# Patient Record
Sex: Male | Born: 1990 | Race: Black or African American | Hispanic: No | Marital: Single | Smoking: Current every day smoker
Health system: Southern US, Community
[De-identification: ages and names within clinical notes are randomized; demographics above are authoritative.]

---
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
## 2011-06-22 ENCOUNTER — Emergency Department: Payer: Self-pay | Admitting: Emergency Medicine

## 2011-06-22 LAB — COMPREHENSIVE METABOLIC PANEL
Albumin: 4.7 g/dL (ref 3.4–5.0)
Calcium, Total: 9.3 mg/dL (ref 8.5–10.1)
Chloride: 109 mmol/L — ABNORMAL HIGH (ref 98–107)
Co2: 28 mmol/L (ref 21–32)
Creatinine: 0.93 mg/dL (ref 0.60–1.30)
Osmolality: 284 (ref 275–301)
Potassium: 3.6 mmol/L (ref 3.5–5.1)
SGOT(AST): 27 U/L (ref 15–37)
SGPT (ALT): 73 U/L

## 2011-06-22 LAB — CBC
HGB: 14.7 g/dL (ref 13.0–18.0)
MCHC: 33.5 g/dL (ref 32.0–36.0)
RBC: 4.7 10*6/uL (ref 4.40–5.90)
RDW: 13.1 % (ref 11.5–14.5)
WBC: 6.9 10*3/uL (ref 3.8–10.6)

## 2015-03-06 ENCOUNTER — Encounter: Payer: Self-pay | Admitting: *Deleted

## 2015-03-06 ENCOUNTER — Emergency Department
Admission: EM | Admit: 2015-03-06 | Discharge: 2015-03-06 | Disposition: A | Discharge status: Home / Self Care | Payer: 59 | Attending: Emergency Medicine | Admitting: Emergency Medicine

## 2015-03-06 ENCOUNTER — Encounter: Payer: Self-pay | Admitting: Emergency Medicine

## 2015-03-06 ENCOUNTER — Emergency Department: Payer: 59

## 2015-03-06 ENCOUNTER — Ambulatory Visit (INDEPENDENT_AMBULATORY_CARE_PROVIDER_SITE_OTHER)
Admission: EM | Admit: 2015-03-06 | Discharge: 2015-03-06 | Disposition: A | Discharge status: Other Acute Inpatient Hospital | Payer: 59 | Source: Home / Self Care | Attending: Family Medicine | Admitting: Family Medicine

## 2015-03-06 DIAGNOSIS — L0291 Cutaneous abscess, unspecified: Secondary | ICD-10-CM

## 2015-03-06 DIAGNOSIS — N492 Inflammatory disorders of scrotum: Secondary | ICD-10-CM | POA: Diagnosis not present

## 2015-03-06 DIAGNOSIS — R1909 Other intra-abdominal and pelvic swelling, mass and lump: Secondary | ICD-10-CM

## 2015-03-06 DIAGNOSIS — R1031 Right lower quadrant pain: Secondary | ICD-10-CM | POA: Diagnosis present

## 2015-03-06 DIAGNOSIS — F17210 Nicotine dependence, cigarettes, uncomplicated: Secondary | ICD-10-CM | POA: Diagnosis not present

## 2015-03-06 DIAGNOSIS — N5089 Other specified disorders of the male genital organs: Secondary | ICD-10-CM

## 2015-03-06 MED ORDER — OXYCODONE-ACETAMINOPHEN 5-325 MG PO TABS: 2.0000 | ORAL_TABLET | Freq: Four times a day (QID) | ORAL | Status: DC | PRN

## 2015-03-06 MED ORDER — LIDOCAINE-EPINEPHRINE 1 %-1:100000 IJ SOLN
10.0000 mL | Freq: Once | INTRAMUSCULAR | Status: AC
  Administered 2015-03-06: 10 mL via INTRADERMAL
  Filled 2015-03-06 (×2): qty 10

## 2015-03-06 MED ORDER — OXYCODONE-ACETAMINOPHEN 5-325 MG PO TABS
ORAL_TABLET | ORAL | Status: AC
  Administered 2015-03-06: 2 via ORAL
  Filled 2015-03-06: qty 2

## 2015-03-06 MED ORDER — SULFAMETHOXAZOLE-TRIMETHOPRIM 800-160 MG PO TABS: 1.0000 | ORAL_TABLET | Freq: Two times a day (BID) | ORAL | Status: DC

## 2015-03-06 MED ORDER — HYDROMORPHONE HCL 1 MG/ML IJ SOLN
1.0000 mg | Freq: Once | INTRAMUSCULAR | Status: AC
  Administered 2015-03-06: 1 mg via INTRAVENOUS
  Filled 2015-03-06: qty 1

## 2015-03-06 MED ORDER — MUPIROCIN 2 % EX OINT: TOPICAL_OINTMENT | CUTANEOUS | Status: DC

## 2015-03-06 MED ORDER — OXYCODONE-ACETAMINOPHEN 5-325 MG PO TABS
2.0000 | ORAL_TABLET | Freq: Once | ORAL | Status: AC
  Administered 2015-03-06: 2 via ORAL

## 2015-03-06 NOTE — ED Notes (Signed)
Patient reports right groin pain for approximately 5 days.  Seen at urgent care and sent to the ed for further testing.

## 2015-03-06 NOTE — ED Notes (Signed)
Pt complains of pain to right groin area. MD at bedside and abscess noted.

## 2015-03-06 NOTE — ED Provider Notes (Signed)
[facility] Emergency Department Provider Note     Time seen: ----------------------------------------- 7:59 PM on 03/06/2015 -----------------------------------------    I have reviewed the triage vital signs and the nursing notes.   HISTORY  Chief Complaint Groin Pain    HPI Leonard Reese is a 25 y.o. male who presents ER for right groin pain for last 5 days. Patient was seen in urgent care and sent to the ER for further testing. He is complaining of a lump near his testicles since Tuesday, does note he has had recent heavy lifting, denies fevers chills, discharge or any other complaints. Patient states this increased in size slightly.   History reviewed. No pertinent past medical history.  There are no active problems to display for this patient.   History reviewed. No pertinent past surgical history.  Allergies Review of patient's allergies indicates no known allergies.  Social History Social History  Substance Use Topics  . Smoking status: Current Every Day Smoker -- 0.50 packs/day    Types: Cigarettes  . Smokeless tobacco: Never Used  . Alcohol Use: Yes    Review of Systems Constitutional: Negative for fever. Gastrointestinal: Negative for abdominal pain, vomiting and diarrhea. Genitourinary: Negative for dysuria. Positive for right-sided scrotal pain Skin: Positive for swelling near the right testicle Neurological: Negative for headaches, focal weakness or numbness.   ____________________________________________   PHYSICAL EXAM:  VITAL SIGNS: ED Triage Vitals  Enc Vitals Group     BP 03/06/15 1947 130/76 mmHg     Pulse Rate 03/06/15 1947 63     Resp 03/06/15 1947 20     Temp 03/06/15 1947 98.4 F (36.9 C)     Temp Source 03/06/15 1947 Oral     SpO2 03/06/15 1947 100 %     Weight 03/06/15 1947 180 lb (81.647 kg)     Height 03/06/15 1947  (1.803 m)     Head Cir --      Peak Flow --      Pain Score 03/06/15 1948  10     Pain Loc --      Pain Edu? --      Excl. in GC? --     Constitutional: Alert and oriented. Well appearing and in no distress. Gastrointestinal: Soft and nontender. No distention. No abdominal bruits.  Genitourinary: Right proximal scrotal swelling and induration consistent with abscess Musculoskeletal: Nontender with normal range of motion in all extremities. No joint effusions.  No lower extremity tenderness nor edema. Skin:  Mild erythema, induration noted to the right upper outer scrotal region Psychiatric: Mood and affect are normal. ____________________________________________  ED COURSE:  Pertinent labs & imaging results that were available during my care of the patient were reviewed by me and considered in my medical decision making (see chart for details). Patient clinically with developing scrotal abscess. We'll obtain ultrasound and likely need incision and drainage.  INCISION AND DRAINAGE Performed by: Daryel November E Consent: Verbal consent obtained. Risks and benefits: risks, benefits and alternatives were discussed Type: abscess  Body area: Right hemiscrotum  Anesthesia: local infiltration  Incision was made with a scalpel.  Local anesthetic: lidocaine 1 % with epinephrine  Anesthetic total: 3 ml  Complexity: complex Blunt dissection to break up loculations  Drainage: purulent  Drainage amount: Moderate   Packing material: 1/4 in iodoform gauze  Patient tolerance: Patient tolerated the procedure well with no immediate complications.   ____________________________________________   RADIOLOGY Images were viewed by me  Testicular ultrasound  IMPRESSION: Complex collection in the superolateral aspect of the right scrotal wall, likely a hematoma or an abscess. Clinical correlation and follow-up recommended.  Unremarkable testicles.  ____________________________________________  FINAL ASSESSMENT AND PLAN  Abscess, incision and  drainage  Plan: Patient with labs and imaging as dictated above. Patient has undergone incision and drainage of right scrotal abscess. I advised packing removal tomorrow, sitz bath, antibiotics, topical antibiotic ointment and pain medication. He can return for worsening or worrisome symptoms.   Emily Filbert, MD   Emily Filbert, MD 03/06/15 2206

## 2015-03-06 NOTE — ED Notes (Signed)
Patient transported to Ultrasound 

## 2015-03-06 NOTE — ED Notes (Signed)
Patient noticed a bulge on the right side of his groin area this past Tuesday and the pain has become worsened overtime. 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

## 2015-03-06 NOTE — Discharge Instructions (Signed)

## 2015-03-06 NOTE — ED Provider Notes (Signed)
CSN: [id number]     Arrival date & time 03/06/15  1833 History   First MD Initiated Contact with Patient 03/06/15 1925     Chief Complaint  Patient presents with  . Inguinal Hernia   (Consider location/radiation/quality/duration/timing/severity/associated sxs/prior Treatment) HPI Comments: 24 yo male with 4 days h/o a "lump near balls". States Tuesday he noticed a tender small lump on the right side near the upper scrotal area. Denies any trauma/injuries, fevers, chills, dysuria, penile discharge. States has increased in size slightly but pain/tenderness has increased significantly.  The history is provided by the patient.    History reviewed. No pertinent past medical history. History reviewed. No pertinent past surgical history. History reviewed. No pertinent family history. Social History  Substance Use Topics  . Smoking status: Current Every Day Smoker  . Smokeless tobacco: Never Used  . Alcohol Use: No    Review of Systems  Allergies  Review of patient's allergies indicates no known allergies.  Home Medications   Prior to Admission medications   Not on File   Meds Ordered and Administered this Visit  Medications - No data to display  BP 113/71 mmHg  Pulse 71  Temp(Src) 98.1 F (36.7 C) (Oral)  Resp 18  Ht  (1.803 m)  Wt 185 lb (83.915 kg)  BMI 25.81 kg/m2  SpO2 100% No data found.   Physical Exam  Constitutional: He appears well-developed and well-nourished. No distress.  Abdominal: A hernia is present. Hernia confirmed positive in the right inguinal area (inguinal severely tender mass/lump; difficult to examine due to pain/tenderness).  Genitourinary: Testes normal and penis normal.     Skin: He is not diaphoretic.  Nursing note and vitals reviewed.   ED Course  Procedures (including critical care time)  Labs Review Labs Reviewed - No data to display  Imaging Review No results found.   Visual Acuity Review  Right Eye Distance:   Left  Eye Distance:   Bilateral Distance:    Right Eye Near:   Left Eye Near:    Bilateral Near:         MDM   1. Mass of right inguinal region   (unknown etiology)   Discussed possible etiologies with patient, including possibility of incarcerated inguinal hernia; recommend patient go to ED for further evaluation (imaging, etc) and further management    Payton Mccallum, MD 03/06/15 1936

## 2016-02-12 IMAGING — US US SCROTUM
1 series · 13 of 25 positions shown · non-contrast
Comparison: None.

CLINICAL DATA: 24-year-old male with right scrotal pain and painful
lump on upper aspect of the right scrotum.

EXAM:
SCROTAL ULTRASOUND
DOPPLER ULTRASOUND OF THE TESTICLES
TECHNIQUE: Complete ultrasound examination of the testicles, epididymis, and
other scrotal structures was performed. Color and spectral Doppler
ultrasound were also utilized to evaluate blood flow to the
testicles.

[Series 1: us scrotum · 0.08mm/px · 73 acquisitions, 13 frames shown]
[im 1/73]
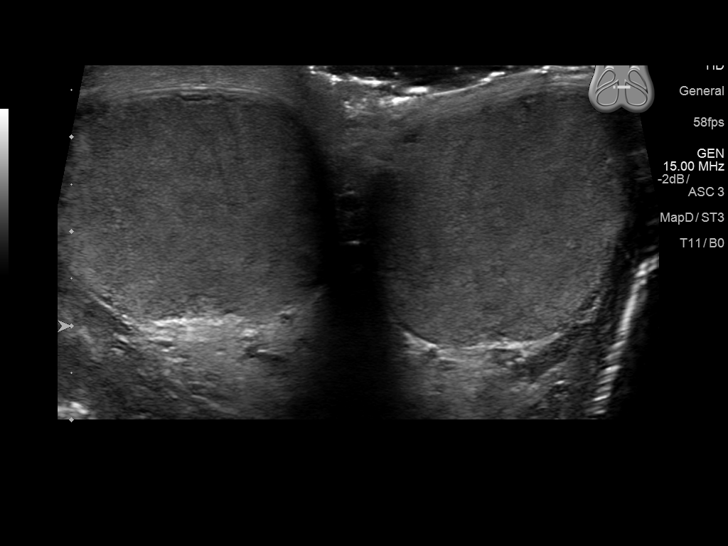
[im 7/73]
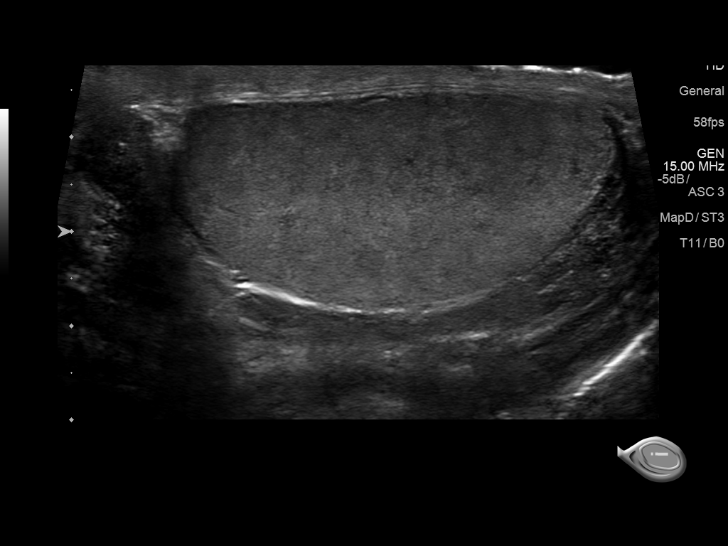
[im 13/73]
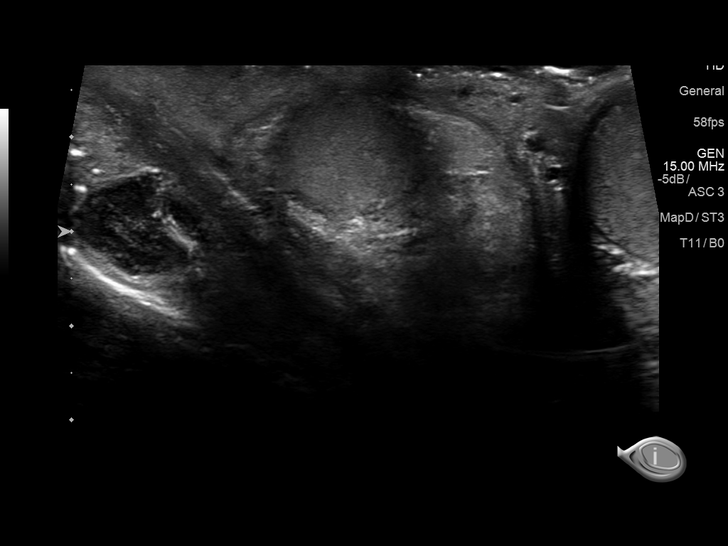
[im 19/73]
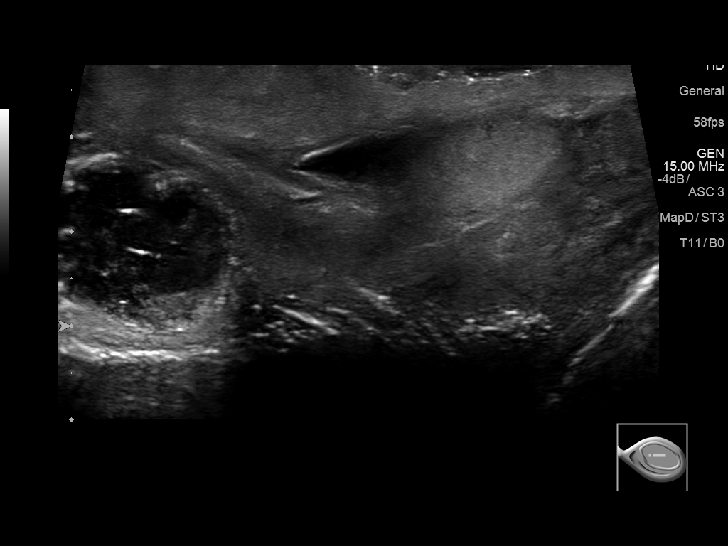
[im 25/73]
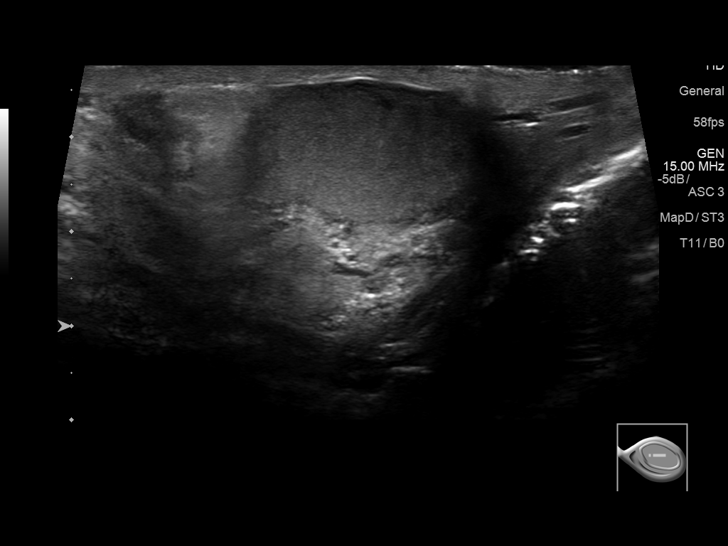
[im 31/73]
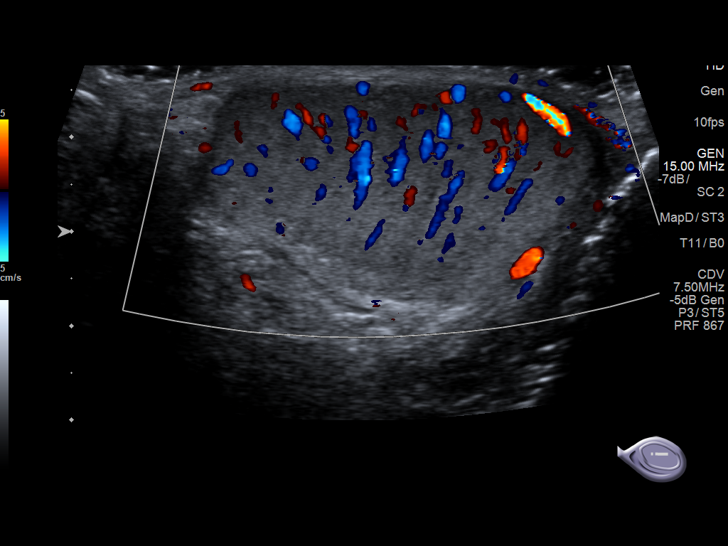
[im 37/73]
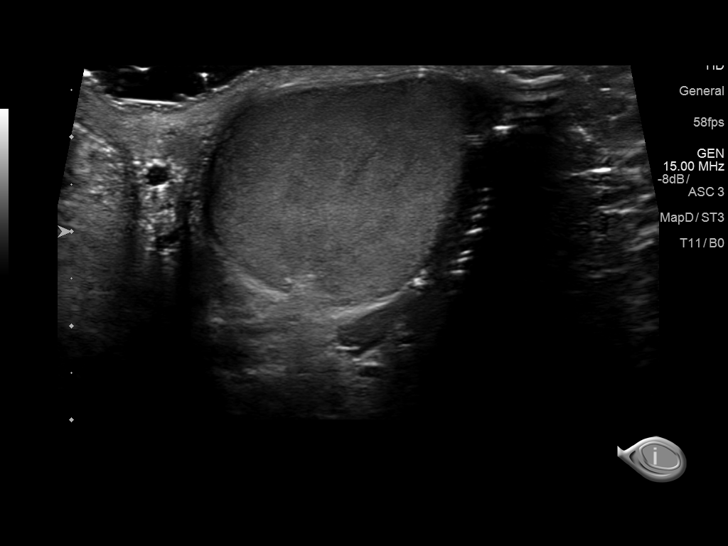
[im 43/73]
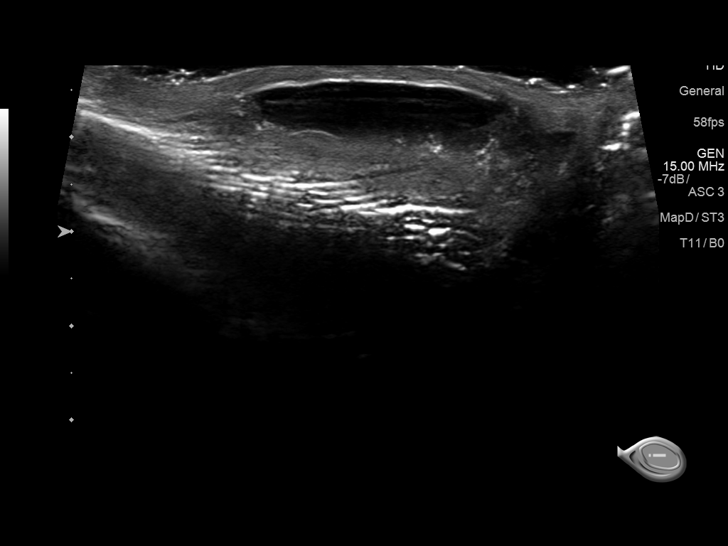
[im 49/73]
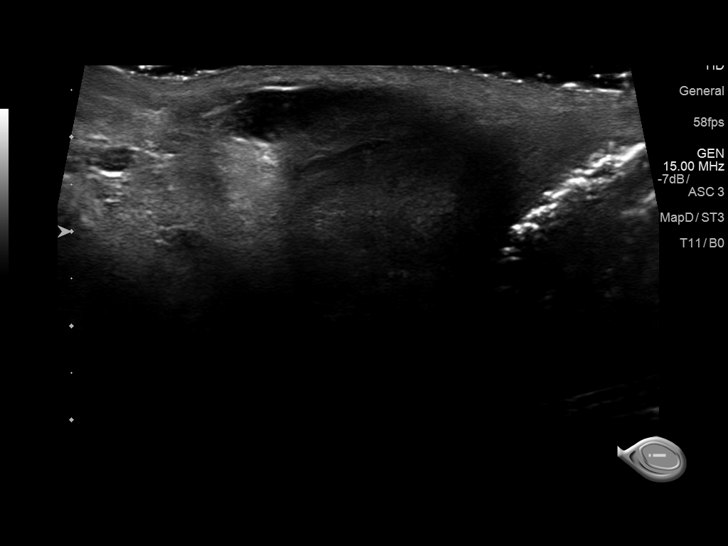
[im 55/73]
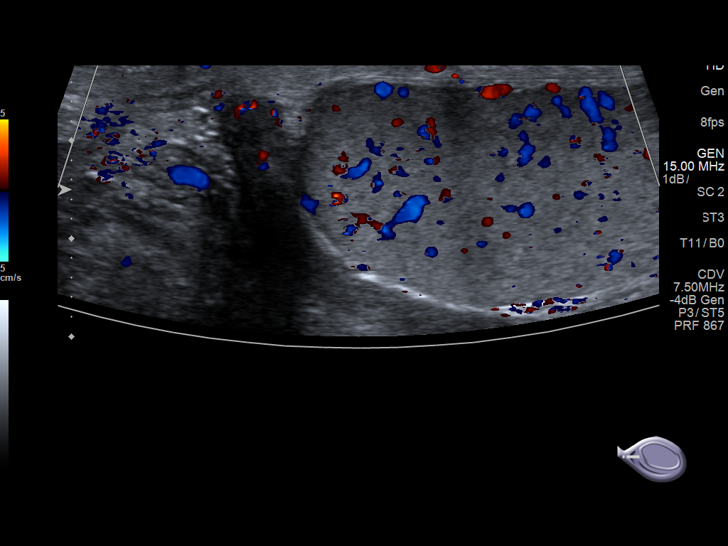
[im 61/73]
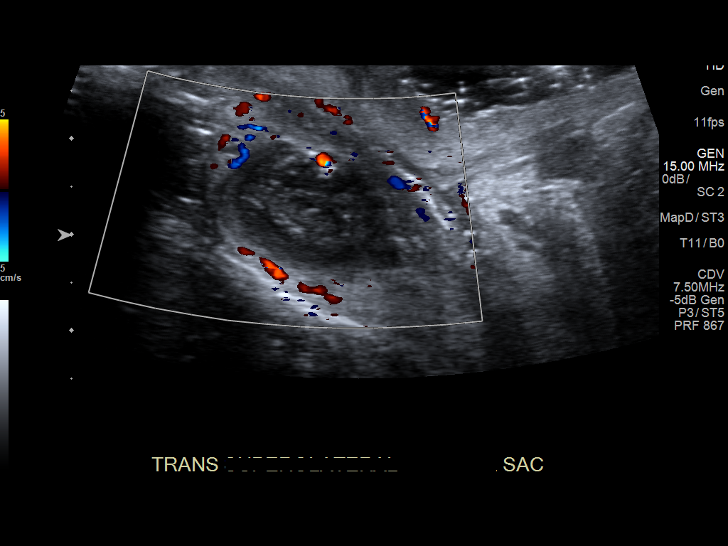
[im 67/73]
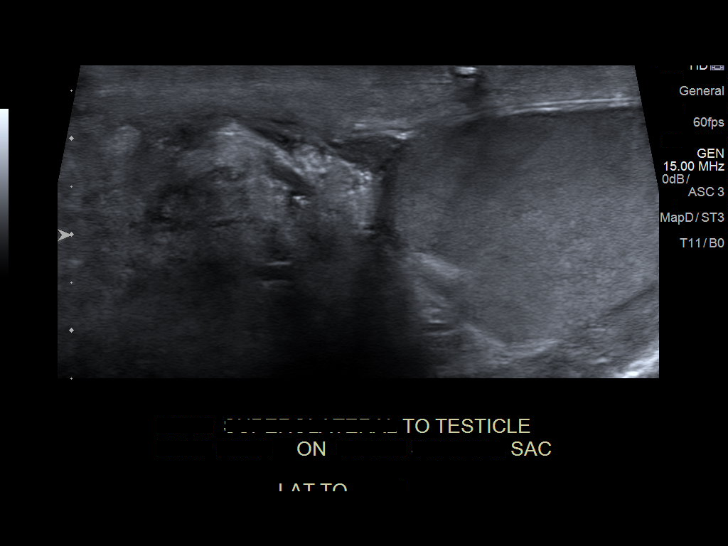
[im 73/73]
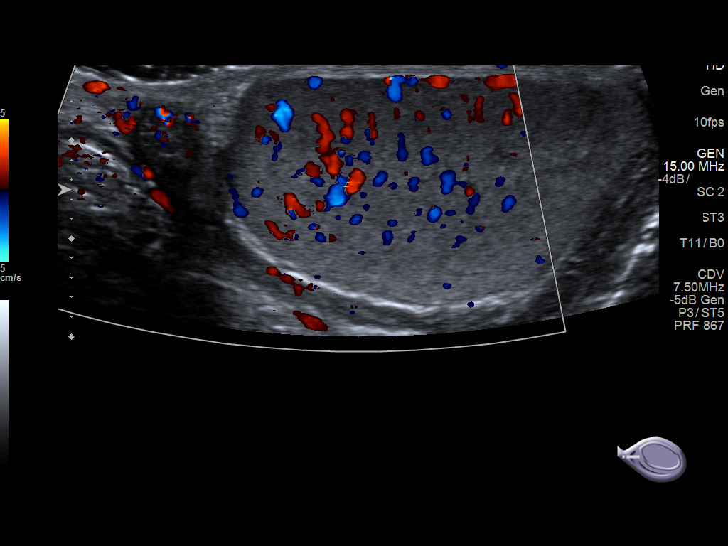

[13 of 25 positions shown; findings below may reference images not displayed]

FINDINGS: Right testicle

Measurements: 4.6 x 2.2 x 3.2 cm. No mass or microlithiasis
visualized.

There is a 2.5 x 1.7 x 2.7 cm complex and heterogeneous hypoechoic
area in the superior lateral aspect of the right scrotal wall.
Doppler images do not demonstrate flow within this collection. This
may represent an old hematoma or an abscess. Correlation with
clinical exam and follow-up recommended.

Left testicle

Measurements: 4.3 x 2.4 x 2.6 cm. No mass or microlithiasis
visualized.

Right epididymis:  Normal in size and appearance.

Left epididymis:  Normal in size and appearance.

Hydrocele:  No significant hydrocele.

Varicocele:  None visualized.

Pulsed Doppler interrogation of both testes demonstrates normal low
resistance arterial and venous waveforms bilaterally.
IMPRESSION: Complex collection in the superolateral aspect of the right scrotal
wall, likely a hematoma or an abscess. Clinical correlation and
follow-up recommended.

Unremarkable testicles.

## 2016-08-02 ENCOUNTER — Ambulatory Visit
Admission: EM | Admit: 2016-08-02 | Discharge: 2016-08-02 | Disposition: A | Discharge status: Home / Self Care | Payer: 59 | Attending: Family Medicine | Admitting: Family Medicine

## 2016-08-02 ENCOUNTER — Ambulatory Visit (INDEPENDENT_AMBULATORY_CARE_PROVIDER_SITE_OTHER): Payer: 59

## 2016-08-02 DIAGNOSIS — R0600 Dyspnea, unspecified: Secondary | ICD-10-CM

## 2016-08-02 DIAGNOSIS — R079 Chest pain, unspecified: Secondary | ICD-10-CM | POA: Diagnosis present

## 2016-08-02 DIAGNOSIS — S70361A Insect bite (nonvenomous), right thigh, initial encounter: Secondary | ICD-10-CM

## 2016-08-02 DIAGNOSIS — W57XXXA Bitten or stung by nonvenomous insect and other nonvenomous arthropods, initial encounter: Secondary | ICD-10-CM

## 2016-08-02 DIAGNOSIS — R112 Nausea with vomiting, unspecified: Secondary | ICD-10-CM

## 2016-08-02 DIAGNOSIS — F17210 Nicotine dependence, cigarettes, uncomplicated: Secondary | ICD-10-CM | POA: Insufficient documentation

## 2016-08-02 DIAGNOSIS — E876 Hypokalemia: Secondary | ICD-10-CM | POA: Diagnosis not present

## 2016-08-02 DIAGNOSIS — R5383 Other fatigue: Secondary | ICD-10-CM | POA: Diagnosis not present

## 2016-08-02 DIAGNOSIS — R5381 Other malaise: Secondary | ICD-10-CM | POA: Diagnosis not present

## 2016-08-02 DIAGNOSIS — E786 Lipoprotein deficiency: Secondary | ICD-10-CM | POA: Insufficient documentation

## 2016-08-02 DIAGNOSIS — R071 Chest pain on breathing: Secondary | ICD-10-CM | POA: Diagnosis not present

## 2016-08-02 LAB — COMPREHENSIVE METABOLIC PANEL
ALBUMIN: 4.3 g/dL (ref 3.5–5.0)
ALT: 29 U/L (ref 17–63)
AST: 18 U/L (ref 15–41)
Alkaline Phosphatase: 53 U/L (ref 38–126)
Anion gap: 8 (ref 5–15)
BUN: 12 mg/dL (ref 6–20)
CHLORIDE: 101 mmol/L (ref 101–111)
CO2: 28 mmol/L (ref 22–32)
Calcium: 9.3 mg/dL (ref 8.9–10.3)
Creatinine, Ser: 0.99 mg/dL (ref 0.61–1.24)
GFR calc Af Amer: >60 mL/min (ref >60)
GFR calc non Af Amer: >60 mL/min (ref >60)
GLUCOSE: 102 mg/dL — AB (ref 65–99)
POTASSIUM: 3.4 mmol/L — AB (ref 3.5–5.1)
Sodium: 137 mmol/L (ref 135–145)
Total Bilirubin: 0.7 mg/dL (ref 0.3–1.2)
Total Protein: 7.8 g/dL (ref 6.5–8.1)

## 2016-08-02 LAB — CBC WITH DIFFERENTIAL/PLATELET
BASOS ABS: 0 10*3/uL (ref 0–0.1)
BASOS PCT: 1 %
EOS ABS: 0 10*3/uL (ref 0–0.7)
EOS PCT: 1 %
HCT: 43.4 % (ref 40.0–52.0)
Hemoglobin: 14.5 g/dL (ref 13.0–18.0)
Lymphocytes Relative: 30 %
Lymphs Abs: 1.4 10*3/uL (ref 1.0–3.6)
MCH: 31 pg (ref 26.0–34.0)
MCHC: 33.4 g/dL (ref 32.0–36.0)
MCV: 92.9 fL (ref 80.0–100.0)
MONO ABS: 0.7 10*3/uL (ref 0.2–1.0)
Monocytes Relative: 16 %
NEUTROS ABS: 2.5 10*3/uL (ref 1.4–6.5)
Neutrophils Relative %: 52 %
PLATELETS: 165 10*3/uL (ref 150–440)
RBC: 4.67 MIL/uL (ref 4.40–5.90)
RDW: 13.1 % (ref 11.5–14.5)
WBC: 4.8 10*3/uL (ref 3.8–10.6)

## 2016-08-02 LAB — CK: CK TOTAL: 123 U/L (ref 49–397)

## 2016-08-02 LAB — TROPONIN I

## 2016-08-02 LAB — CKMB (ARMC ONLY): CK, MB: 0.7 ng/mL (ref 0.5–5.0)

## 2016-08-02 MED ORDER — ALBUTEROL SULFATE HFA 108 (90 BASE) MCG/ACT IN AERS: 2.0000 | INHALATION_SPRAY | RESPIRATORY_TRACT | 0 refills | Status: AC | PRN

## 2016-08-02 MED ORDER — ONDANSETRON 8 MG PO TBDP
8.0000 mg | ORAL_TABLET | Freq: Once | ORAL | Status: AC
  Administered 2016-08-02: 8 mg via ORAL

## 2016-08-02 MED ORDER — BENZONATATE 200 MG PO CAPS: 200.0000 mg | ORAL_CAPSULE | Freq: Three times a day (TID) | ORAL | 0 refills | Status: AC | PRN

## 2016-08-02 MED ORDER — POTASSIUM CHLORIDE CRYS ER 20 MEQ PO TBCR
40.0000 meq | EXTENDED_RELEASE_TABLET | Freq: Once | ORAL | Status: AC
  Administered 2016-08-02: 40 meq via ORAL

## 2016-08-02 MED ORDER — ONDANSETRON 8 MG PO TBDP: 8.0000 mg | ORAL_TABLET | Freq: Three times a day (TID) | ORAL | 0 refills | Status: AC | PRN

## 2016-08-02 NOTE — ED Triage Notes (Signed)
Pt says yesterday he had some chest pain while coughing, and he has been throwing up .

## 2016-08-02 NOTE — ED Provider Notes (Signed)
[facility]    CSN: [id number] Arrival date & time: 08/02/16  0805     History   Chief Complaint Chief Complaint  Patient presents with  . Chest Pain    HPI Leonard Reese is a 26 y.o. male.   Patient is a 26 year old black male who reports having chest pain that started yesterday. Reports having chest pain on breathing. When he wasn't taking deep breath he did not have pain. With this pain is continued through today and he has some nausea and vomiting yesterday he had more nausea and vomiting today. Took  a friend to work and he threw up when he got to work. 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 He smokes daily. No previous surgeries. He reports his mother has diagnosis COPD a number of years for least 13 years and she still smokes and his father died of a massive heart attack when he was [age]. Asked specifically if there is any tick or right exposure he denies any but does report feeling fatigued tired as well as shortness breath   The history is provided by the patient and a significant other. No language interpreter was used.  Chest Pain  Pain location:  Unable to specify Pain quality: sharp   Pain quality: not radiating   Pain radiates to:  Epigastrium Pain severity:  Moderate Onset quality:  Sudden Duration:  2 days Timing:  Constant Progression:  Unchanged Chronicity:  New Context: at rest   Relieved by:  Nothing Worsened by:  Coughing and movement Associated symptoms: nausea, shortness of breath and vomiting   Risk factors: male sex   Risk factors comment:  Hx of ulceer DX   History reviewed. No pertinent past medical history.  There are no active problems to display for this patient.   History reviewed. No pertinent surgical history.     Home Medications    Prior to Admission medications   Medication Sig Start Date End Date Taking? Authorizing Provider  albuterol (PROVENTIL HFA;VENTOLIN HFA)  108 (90 Base) MCG/ACT inhaler Inhale 2 puffs into the lungs every 4 (four) hours as needed. 08/02/16   Hassan RowanWade, Bandon Sherwin, MD  benzonatate (TESSALON) 200 MG capsule Take 1 capsule (200 mg total) by mouth 3 (three) times daily as needed. 08/02/16   Hassan RowanWade, Jalaya Sarver, MD  ondansetron (ZOFRAN ODT) 8 MG disintegrating tablet Take 1 tablet (8 mg total) by mouth every 8 (eight) hours as needed for nausea or vomiting. 08/02/16   Hassan RowanWade, Jaye Polidori, MD    Family History Family History  Problem Relation Age of Onset  . Heart attack Father   . COPD Mother     Social History Social History  Substance Use Topics  . Smoking status: Current Every Day Smoker    Packs/day: 0.50    Types: Cigarettes  . Smokeless tobacco: Never Used  . Alcohol use Yes     Allergies   Patient has no known allergies.   Review of Systems Review of Systems  Respiratory: Positive for shortness of breath.   Cardiovascular: Positive for chest pain.  Gastrointestinal: Positive for nausea and vomiting.  All other systems reviewed and are negative.    Physical Exam Triage Vital Signs ED Triage Vitals  Enc Vitals Group     BP 08/02/16 0839 117/72     Pulse Rate 08/02/16 0839 72     Resp 08/02/16 0839 18     Temp 08/02/16  0839 98.6 F (37 C)     Temp Source 08/02/16 0839 Oral     SpO2 08/02/16 0839 100 %     Weight 08/02/16 0836 150 lb (68 kg)     Height 08/02/16 0836 5\' 8"  (1.727 m)     Head Circumference --      Peak Flow --      Pain Score 08/02/16 0837 7     Pain Loc --      Pain Edu? --      Excl. in GC? --    No data found.   Updated Vital Signs BP 117/72 (BP Location: Right Arm)   Pulse 72   Temp 98.6 F (37 C) (Oral)   Resp 18   Ht 5\' 8"  (1.727 m)   Wt 150 lb (68 kg)   SpO2 100%   BMI 22.81 kg/m   Visual Acuity Right Eye Distance:   Left Eye Distance:   Bilateral Distance:    Right Eye Near:   Left Eye Near:    Bilateral Near:     Physical Exam  Constitutional: He is oriented to person, place,  and time. He appears well-developed and well-nourished. No distress.  HENT:  Head: Normocephalic and atraumatic.  Right Ear: External ear normal.  Left Ear: External ear normal.  Mouth/Throat: Oropharynx is clear and moist.  Neck: Normal range of motion. Neck supple. No tracheal deviation present.  Cardiovascular: Normal rate, regular rhythm and normal heart sounds.   Pulmonary/Chest: Effort normal.  Musculoskeletal: Normal range of motion. He exhibits no edema or deformity.  Neurological: He is alert and oriented to person, place, and time.  Skin: Skin is warm. He is not diaphoretic.  Psychiatric: He has a normal mood and affect.  Vitals reviewed.    UC Treatments / Results  Labs (all labs ordered are listed, but only abnormal results are displayed) Labs Reviewed  COMPREHENSIVE METABOLIC PANEL - Abnormal; Notable for the following:       Result Value   Potassium 3.4 (*)    Glucose, Bld 102 (*)    All other components within normal limits  CBC WITH DIFFERENTIAL/PLATELET  CK  CKMB([facility] ONLY)  TROPONIN I  H PYLORI, IGM, IGG, IGA AB  ROCKY MTN SPOTTED FVR ABS PNL(IGG+IGM)  B. BURGDORFI ANTIBODIES    EKG  EKG Interpretation None     ED ECG REPORT I, Katarina Riebe H, the attending physician, personally viewed and interpreted this ECG.   Date: 08/02/2016  EKG Time:08:45:18  Rate:70 Rhythm: normal EKG, normal sinus rhythm, there are no previous tracings available for comparison  Axis: 61  Intervals:none  ST&T Change: none  Radiology Dg Abd Acute W/chest  Result Date: 08/02/2016 CLINICAL DATA:  Chest pain with nausea and vomiting EXAM: DG ABDOMEN ACUTE W/ 1V CHEST COMPARISON:  None. FINDINGS: Cardiac shadow is within normal limits. The lungs are well aerated bilaterally. Bullous changes are noted in the apices consistent with COPD. No focal infiltrate is noted. Scattered large and small bowel gas is seen. No obstructive changes are noted. No bony abnormality is seen.  IMPRESSION: No acute abnormality noted. Electronically Signed   By: Alcide Clever M.D.   On: 08/02/2016 09:24    Procedures Procedures (including critical care time)  Medications Ordered in UC Medications  ondansetron (ZOFRAN-ODT) disintegrating tablet 8 mg (8 mg Oral Given 08/02/16 1033)  potassium chloride SA (K-DUR,KLOR-CON) CR tablet 40 mEq (40 mEq Oral Given 08/02/16 1054)    Results for orders placed  or performed during the hospital encounter of 08/02/16  CBC with Differential  Result Value Ref Range   WBC 4.8 3.8 - 10.6 K/uL   RBC 4.67 4.40 - 5.90 MIL/uL   Hemoglobin 14.5 13.0 - 18.0 g/dL   HCT 16.1 09.6 - 04.5 %   MCV 92.9 80.0 - 100.0 fL   MCH 31.0 26.0 - 34.0 pg   MCHC 33.4 32.0 - 36.0 g/dL   RDW 40.9 81.1 - 91.4 %   Platelets 165 150 - 440 K/uL   Neutrophils Relative % 52 %   Neutro Abs 2.5 1.4 - 6.5 K/uL   Lymphocytes Relative 30 %   Lymphs Abs 1.4 1.0 - 3.6 K/uL   Monocytes Relative 16 %   Monocytes Absolute 0.7 0.2 - 1.0 K/uL   Eosinophils Relative 1 %   Eosinophils Absolute 0.0 0 - 0.7 K/uL   Basophils Relative 1 %   Basophils Absolute 0.0 0 - 0.1 K/uL  Comprehensive metabolic panel  Result Value Ref Range   Sodium 137 135 - 145 mmol/L   Potassium 3.4 (L) 3.5 - 5.1 mmol/L   Chloride 101 101 - 111 mmol/L   CO2 28 22 - 32 mmol/L   Glucose, Bld 102 (H) 65 - 99 mg/dL   BUN 12 6 - 20 mg/dL   Creatinine, Ser 7.82 0.61 - 1.24 mg/dL   Calcium 9.3 8.9 - 95.6 mg/dL   Total Protein 7.8 6.5 - 8.1 g/dL   Albumin 4.3 3.5 - 5.0 g/dL   AST 18 15 - 41 U/L   ALT 29 17 - 63 U/L   Alkaline Phosphatase 53 38 - 126 U/L   Total Bilirubin 0.7 0.3 - 1.2 mg/dL   GFR calc non Af Amer >60 >60 mL/min   GFR calc Af Amer >60 >60 mL/min   Anion gap 8 5 - 15  CK  Result Value Ref Range   Total CK 123 49 - 397 U/L  CKMB([facility] only)  Result Value Ref Range   CK, MB 0.7 0.5 - 5.0 ng/mL  Troponin I  Result Value Ref Range   Troponin I <0.03 <0.03 ng/mL   Initial Impression /  Assessment and Plan / UC Course  I have reviewed the triage vital signs and the nursing notes.  Pertinent labs & imaging results that were available during my care of the patient were reviewed by me and considered in my medical decision making (see chart for details).  Patient with hypokalemia was given a dose of potassium while here for the nausea and vomiting using a dose of Zofran and will give him a prescription for Zofran for the cough Tessalon Perles. Chest x-ray shows some early signs changes possible COPD he was warned of about his smoking and given albuterol inhaler for his breathing.  As he was been giving papers for discharge he has now formed [place] that he had 3 ticks on his left leg that was removed about 2 weeks ago. We did forcing get RMSF and Lyme's titers ordered earlier and will await those before we treat medication. Given work note for today and tomorrow.    Strongly suggest a follow-up with his PCP or the ED if not better.  Final Clinical Impressions(s) / UC Diagnoses   Final diagnoses:  Chest pain on breathing  Dyspnea, unspecified type  Malaise and fatigue  Non-intractable vomiting with nausea, unspecified vomiting type  Hypokalemia  Tick bite of right thigh, initial encounter    New Prescriptions Discharge Medication  List as of 08/02/2016 10:54 AM    START taking these medications   Details  albuterol (PROVENTIL HFA;VENTOLIN HFA) 108 (90 Base) MCG/ACT inhaler Inhale 2 puffs into the lungs every 4 (four) hours as needed., Starting Tue 08/02/2016, Normal    benzonatate (TESSALON) 200 MG capsule Take 1 capsule (200 mg total) by mouth 3 (three) times daily as needed., Starting Tue 08/02/2016, Normal    ondansetron (ZOFRAN ODT) 8 MG disintegrating tablet Take 1 tablet (8 mg total) by mouth every 8 (eight) hours as needed for nausea or vomiting., Starting Tue 08/02/2016, Normal         Note: This dictation was prepared with Dragon dictation along with smaller phrase  technology. Any transcriptional errors that result from this process are unintentional.    Hassan Rowan, MD 08/02/16 1119

## 2016-08-03 LAB — H PYLORI, IGM, IGG, IGA AB
H Pylori IgG: <0.8 Index Value (ref 0.00–0.79)
H. Pylogi, Igm Abs: <9 units (ref 0.0–8.9)

## 2016-08-03 LAB — B. BURGDORFI ANTIBODIES: B burgdorferi Ab IgG+IgM: <0.91 {ISR} (ref 0.00–0.90)

## 2016-08-04 LAB — ROCKY MTN SPOTTED FVR ABS PNL(IGG+IGM)
RMSF IgG: POSITIVE — AB
RMSF IgM: 0.39 index (ref 0.00–0.89)

## 2016-08-04 LAB — RMSF, IGG, IFA: RMSF, IGG, IFA: 1:64 {titer} — ABNORMAL HIGH

## 2016-08-05 ENCOUNTER — Telehealth: Payer: Self-pay

## 2016-08-05 MED ORDER — DOXYCYCLINE HYCLATE 100 MG PO CAPS: 100.0000 mg | ORAL_CAPSULE | Freq: Two times a day (BID) | ORAL | 0 refills | Status: AC

## 2016-08-05 NOTE — Telephone Encounter (Signed)
-----   Message from Leonard RowanEugene Wade, MD sent at 08/05/2016  8:47 AM EDT ----- Please notify patient that his times test was negative and H. pylori test was negative as far as ulcer disease was present. [facility]  [facility]  appears  to be positive.. Because the levels are positive I will recommend strongly that we place him on doxycycline 100 mg twice a day for the next 2 weeks. Please: A total of 30 tablets for him stressed importance taking all gone and follow-up for PCP of his choice in about a month to 6 weeks for reevaluation

## 2018-08-29 IMAGING — CR DG ABDOMEN ACUTE W/ 1V CHEST
3 series · 3 of 3 positions shown · non-contrast
Comparison: None.

CLINICAL DATA: Chest pain with nausea and vomiting

EXAM:
DG ABDOMEN ACUTE W/ 1V CHEST

[chest pa]
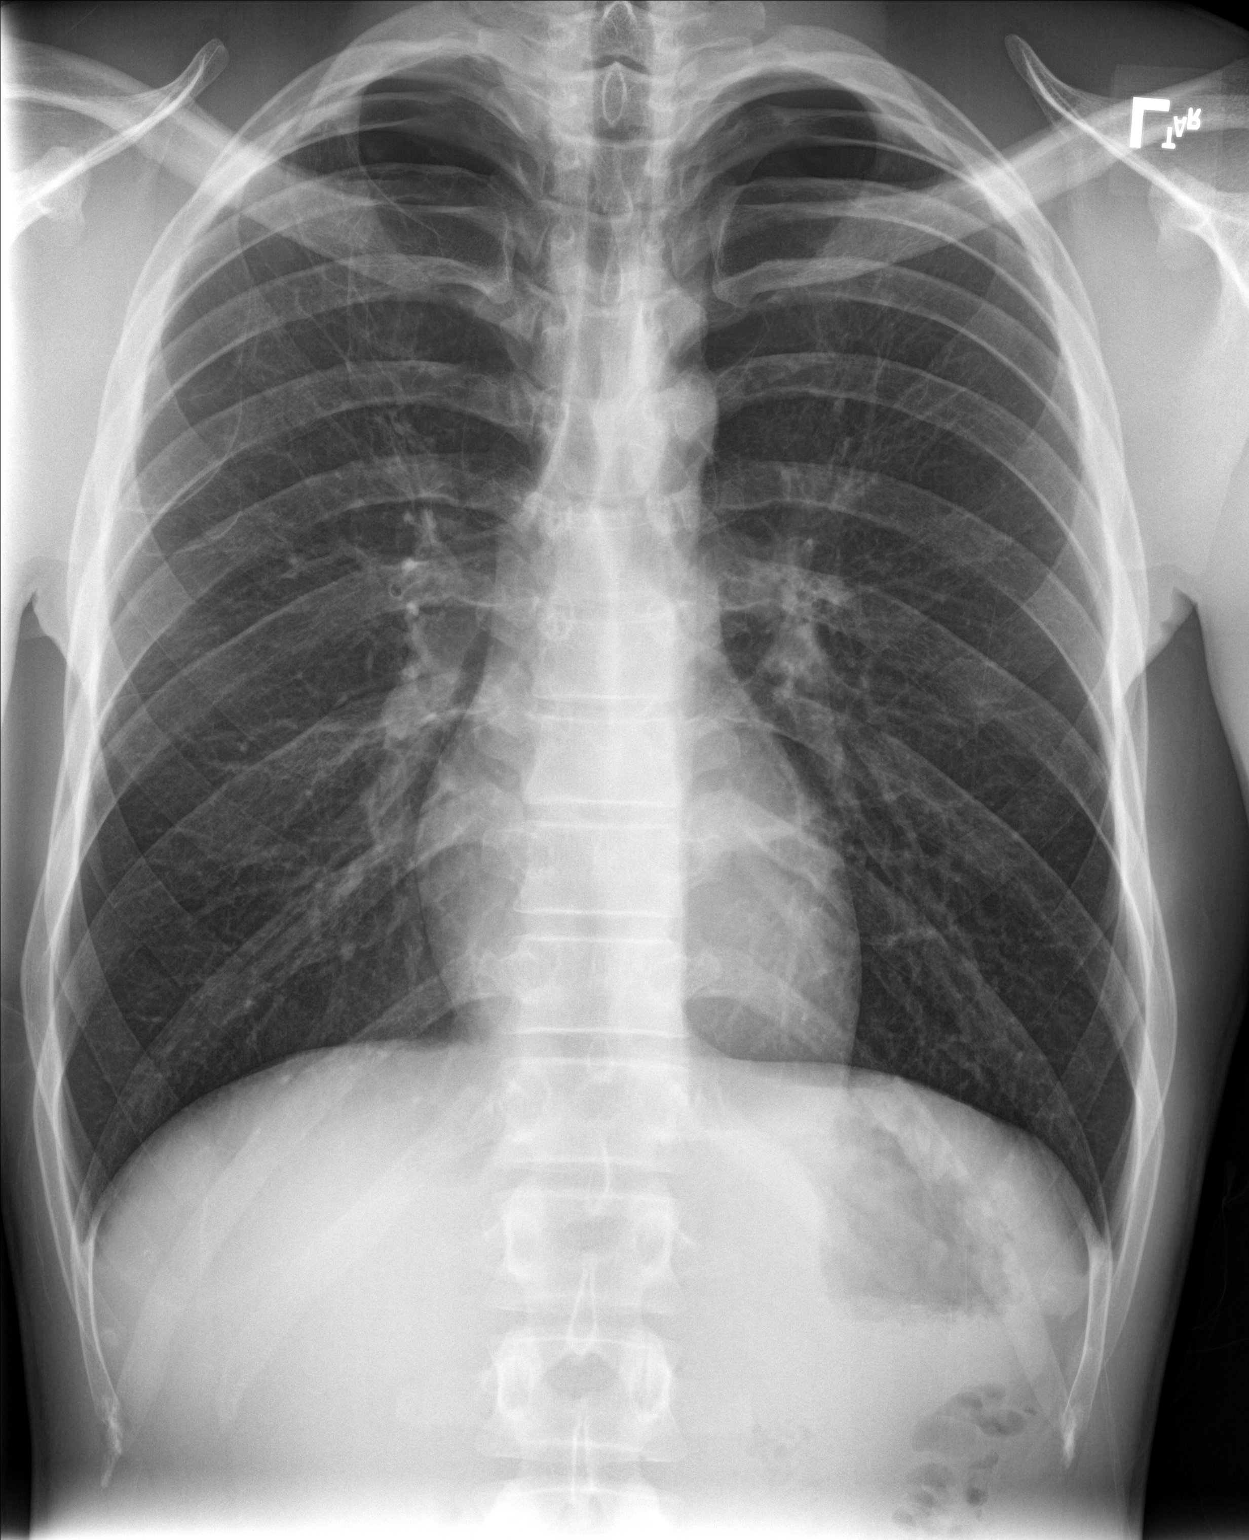

[abdomen erect]
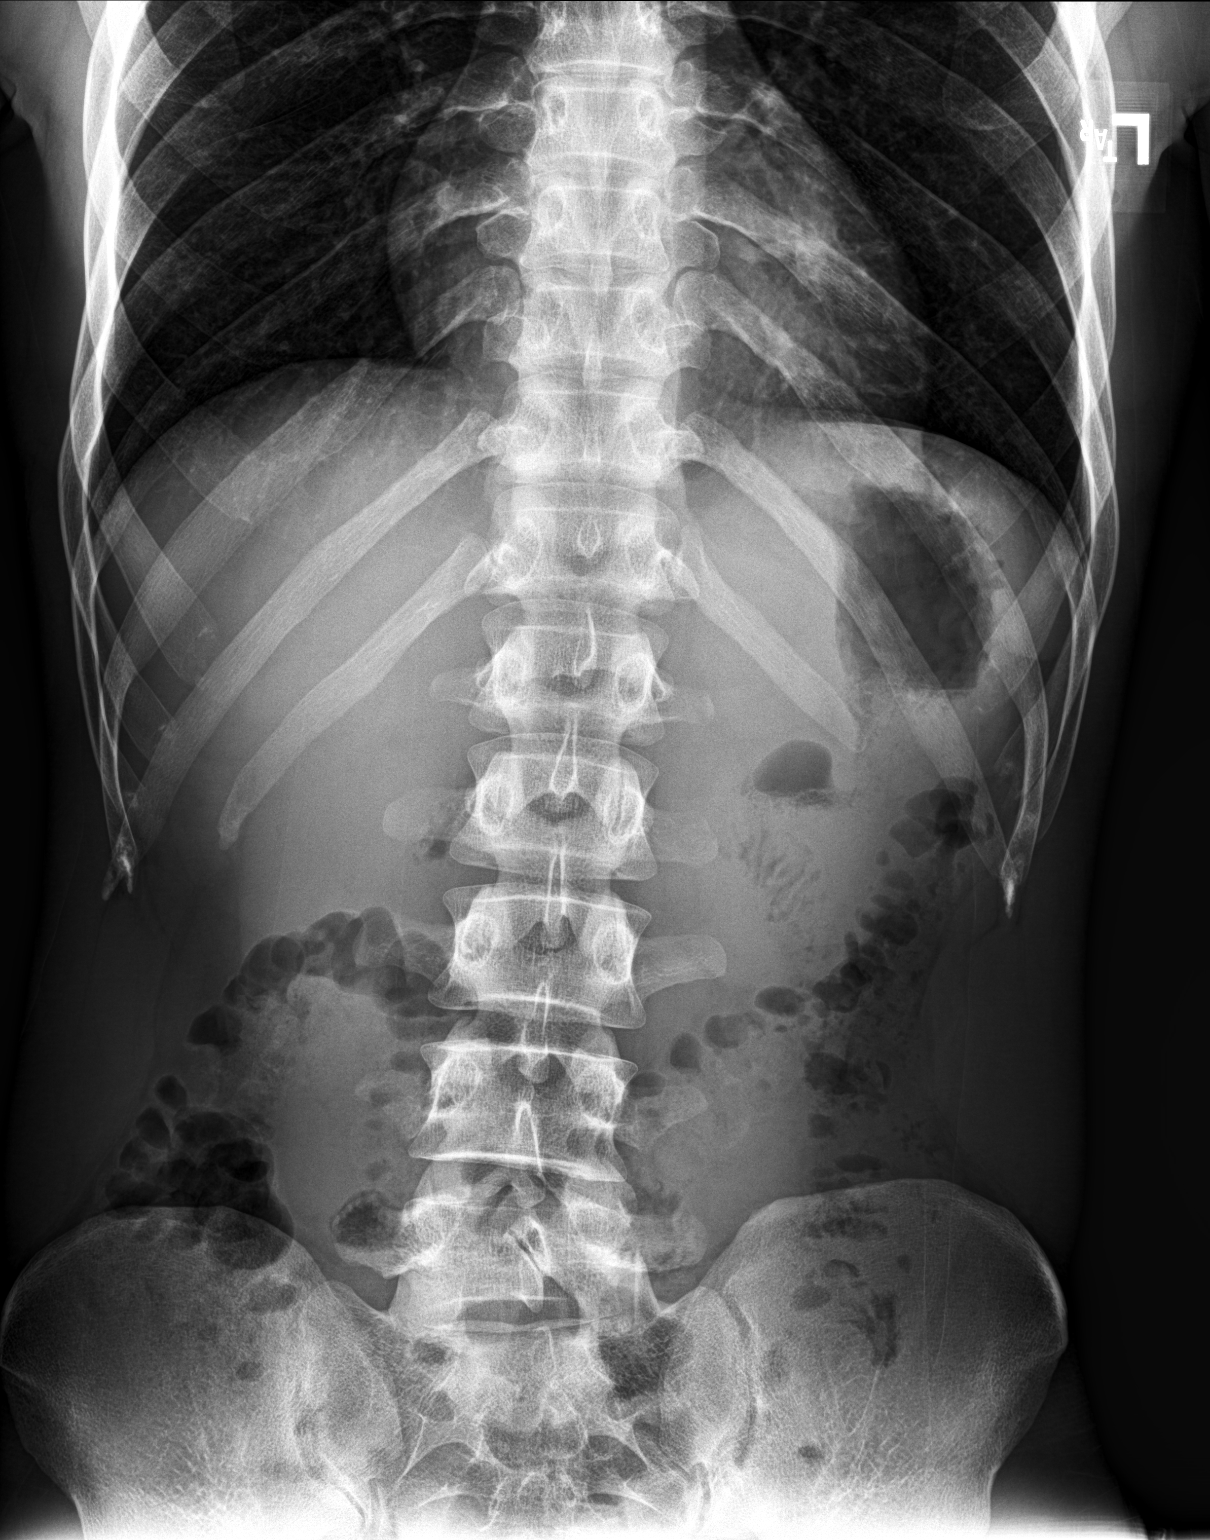

[abdomen supine]
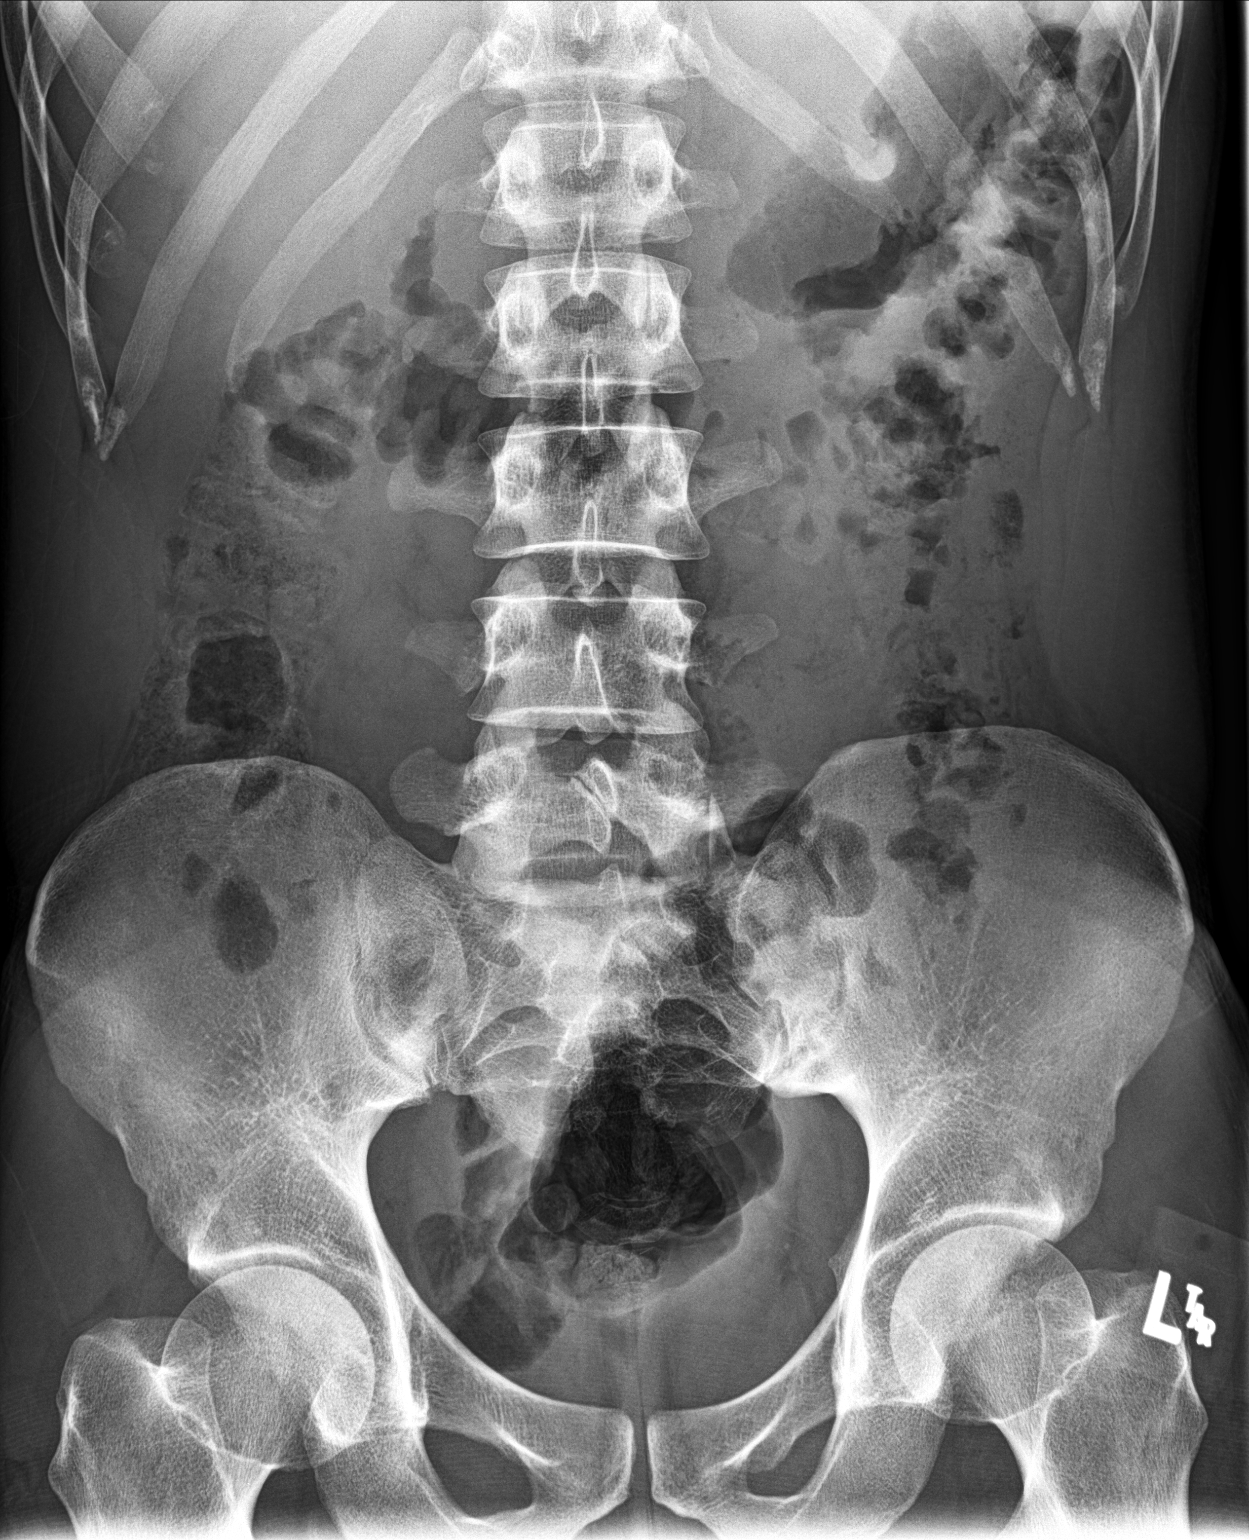

[3 of 3 positions shown; findings below may reference images not displayed]

FINDINGS: Cardiac shadow is within normal limits. The lungs are well aerated
bilaterally. Bullous changes are noted in the apices consistent with
COPD. No focal infiltrate is noted.

Scattered large and small bowel gas is seen. No obstructive changes
are noted. No bony abnormality is seen.
IMPRESSION: No acute abnormality noted.
